# Patient Record
Sex: Female | Born: 1980 | Race: White | Hispanic: No | Marital: Married | Smoking: Never smoker
Health system: Southern US, Community
[De-identification: ages and names within clinical notes are randomized; demographics above are authoritative.]

## PROBLEM LIST (undated history)

## (undated) DIAGNOSIS — R000 Tachycardia, unspecified: Secondary | ICD-10-CM

## (undated) DIAGNOSIS — N809 Endometriosis, unspecified: Secondary | ICD-10-CM

## (undated) DIAGNOSIS — A419 Sepsis, unspecified organism: Secondary | ICD-10-CM

## (undated) DIAGNOSIS — C4491 Basal cell carcinoma of skin, unspecified: Secondary | ICD-10-CM

## (undated) DIAGNOSIS — R079 Chest pain, unspecified: Secondary | ICD-10-CM

## (undated) DIAGNOSIS — E782 Mixed hyperlipidemia: Secondary | ICD-10-CM

## (undated) DIAGNOSIS — R002 Palpitations: Secondary | ICD-10-CM

## (undated) DIAGNOSIS — R7401 Elevation of levels of liver transaminase levels: Secondary | ICD-10-CM

## (undated) DIAGNOSIS — E538 Deficiency of other specified B group vitamins: Secondary | ICD-10-CM

## (undated) DIAGNOSIS — E559 Vitamin D deficiency, unspecified: Secondary | ICD-10-CM

## (undated) DIAGNOSIS — I10 Essential (primary) hypertension: Secondary | ICD-10-CM

## (undated) DIAGNOSIS — E119 Type 2 diabetes mellitus without complications: Secondary | ICD-10-CM

## (undated) HISTORY — DX: Chest pain, unspecified: R07.9

## (undated) HISTORY — DX: Basal cell carcinoma of skin, unspecified: C44.91

## (undated) HISTORY — DX: Type 2 diabetes mellitus without complications: E11.9

## (undated) HISTORY — DX: Elevation of levels of liver transaminase levels: R74.01

## (undated) HISTORY — DX: Deficiency of other specified B group vitamins: E53.8

## (undated) HISTORY — DX: Essential (primary) hypertension: I10

## (undated) HISTORY — DX: Mixed hyperlipidemia: E78.2

## (undated) HISTORY — DX: Palpitations: R00.2

## (undated) HISTORY — DX: Vitamin D deficiency, unspecified: E55.9

## (undated) HISTORY — DX: Tachycardia, unspecified: R00.0

## (undated) HISTORY — DX: Sepsis, unspecified organism: A41.9

## (undated) HISTORY — DX: Endometriosis, unspecified: N80.9

---

## 2014-02-06 HISTORY — PX: LAPAROSCOPY: SHX197

## 2014-02-09 ENCOUNTER — Ambulatory Visit: Payer: Self-pay | Admitting: Obstetrics and Gynecology

## 2014-02-09 LAB — BASIC METABOLIC PANEL
ANION GAP: 6 — AB (ref 7–16)
BUN: 9 mg/dL (ref 7–18)
CO2: 24 mmol/L (ref 21–32)
Calcium, Total: 8.3 mg/dL — ABNORMAL LOW (ref 8.5–10.1)
Chloride: 110 mmol/L — ABNORMAL HIGH (ref 98–107)
Creatinine: 0.79 mg/dL (ref 0.60–1.30)
EGFR (African American): >60
EGFR (Non-African Amer.): >60
Glucose: 97 mg/dL (ref 65–99)
Osmolality: 278 (ref 275–301)
Potassium: 3.4 mmol/L — ABNORMAL LOW (ref 3.5–5.1)
Sodium: 140 mmol/L (ref 136–145)

## 2014-02-09 LAB — HEMOGLOBIN: HGB: 12.3 g/dL (ref 12.0–16.0)

## 2014-02-13 ENCOUNTER — Ambulatory Visit: Payer: Self-pay | Admitting: Obstetrics and Gynecology

## 2014-06-01 LAB — SURGICAL PATHOLOGY

## 2014-06-07 NOTE — Op Note (Signed)
PATIENT NAME:  Donna Mercado, Donna Mercado MR#:  [id number] DATE OF BIRTH:  July 18, 1980  DATE OF PROCEDURE:  02/13/2014  PREOPERATIVE DIAGNOSES: 1.  Chronic pelvic pain.  2.  Endometrial polyp.   POSTOPERATIVE DIAGNOSES: 1.  Chronic pelvic pain.  2.  Endometriosis.  3.  Endometrial polyp.   PROCEDURES:  1.  Hysteroscopy. 2.  Endometrial polypectomy. 3.  Laparoscopic excision of posterior cul-de-sac endometriosis.   SURGEON: Laverta Baltimore, M.D.   ANESTHESIA: General endotracheal.  INDICATIONS: A 34 year old gravida 0, a patient with 4 month history of central dyspareunia and central. Ultrasound shows a 2 x 1 cm endometrial polyp.   FINDINGS:  1.  A 7 x 5 mm endometrial polyp. 2.  Scarring in the posterior cul-de-sac consistent with endometriosis.  3.  Powder burn areas of the left mesosalpinx consistent with endometriosis.   DESCRIPTION OF PROCEDURE: After adequate general endotracheal anesthesia, the patient was placed in the dorsal supine position. Legs were placed in the [place]. The patient's abdomen, perineum, and vagina were prepped and draped in normal sterile fashion. Straight catheterization of the bladder yielded 75 mL clear urine. A weighted speculum was placed in the posterior vaginal vault and the anterior cervix was grasped with a single-tooth tenaculum. The cervix was dilated to #20 Hanks dilator without difficulty. The hysteroscope was advanced into the endometrial cavity. The cavity appeared atrophic, except for a 5 x 7 mm pedunculated polyp. Hysteroscope was removed and Randall stone forceps were placed with retrieval of the polyp. Endometrial curettage was performed as well with no additional polyps. Gloves were changed, and the patient was flattened out, and a 12 mm infraumbilical incision was made after injecting with 0.5% Marcaine. The 11 mm laparoscopic port was placed under direct visualization with the Optiview cannula. Once gaining entrance into the abdominal  cavity, the abdomen was insufflated with carbon dioxide. A second port site was placed 3 cm medial to the left anterior iliac spine, and a 5 mm port was advanced under direct visualization in the abdominal cavity. A third port site was placed on the right, again 3 cm medial to the right anterior iliac spine. Under direct visualization, a 5 mm port was advanced.  Initial impression showed some dense white scarring in the posterior cul-de-sac. There was also a few powder burn areas on the left mesosalpinx and the left ovarian fossa had some hemosiderin staining. Harmonic scalpel was brought up and the deep posterior cul-de-sac area was grasped with graspers, and the white scarred area was removed with the Harmonic scalpel. Good hemostasis was noted. The patient's abdomen was copiously irrigated. The upper abdomen appeared normal. The patient's abdomen was deflated then. The infraumbilical incision was closed with a deep fascial layer of 2-0 Vicryl suture and then all skin incisions were closed with interrupted 4-0 Vicryl suture. Dermabond was placed and Tegaderm was placed over top these. The single-tooth tenaculum was removed from the anterior cervix. Good hemostasis was noted. There were no complications. Estimated blood loss minimal. Intraoperative fluids 1000 mL. The patient was taken to the recovery room in good condition. One gram of IV Ancef was used prior to commencement of the case.  ____________________________ Boykin Nearing, MD tjs:sb D: 02/13/2014 11:00:51 ET T: 02/13/2014 12:07:24 ET JOB#: [id number]  cc: Boykin Nearing, MD, <Dictator> Boykin Nearing MD ELECTRONICALLY SIGNED 02/17/2014 9:17

## 2016-11-30 ENCOUNTER — Other Ambulatory Visit: Payer: Self-pay | Admitting: Obstetrics and Gynecology

## 2016-11-30 DIAGNOSIS — Z1231 Encounter for screening mammogram for malignant neoplasm of breast: Secondary | ICD-10-CM

## 2020-01-22 ENCOUNTER — Other Ambulatory Visit: Payer: Self-pay | Admitting: Obstetrics and Gynecology

## 2020-01-22 DIAGNOSIS — Z1231 Encounter for screening mammogram for malignant neoplasm of breast: Secondary | ICD-10-CM

## 2020-02-05 ENCOUNTER — Other Ambulatory Visit: Payer: Self-pay

## 2020-02-05 ENCOUNTER — Ambulatory Visit
Admission: RE | Admit: 2020-02-05 | Discharge: 2020-02-05 | Disposition: A | Discharge status: Home / Self Care | Payer: BC Managed Care – PPO | Source: Ambulatory Visit | Attending: Obstetrics and Gynecology | Admitting: Obstetrics and Gynecology

## 2020-02-05 DIAGNOSIS — Z1231 Encounter for screening mammogram for malignant neoplasm of breast: Secondary | ICD-10-CM | POA: Diagnosis present

## 2020-02-11 ENCOUNTER — Other Ambulatory Visit: Payer: Self-pay | Admitting: Obstetrics and Gynecology

## 2020-02-11 DIAGNOSIS — R921 Mammographic calcification found on diagnostic imaging of breast: Secondary | ICD-10-CM

## 2020-02-11 DIAGNOSIS — R928 Other abnormal and inconclusive findings on diagnostic imaging of breast: Secondary | ICD-10-CM

## 2020-02-17 ENCOUNTER — Ambulatory Visit
Admission: RE | Admit: 2020-02-17 | Discharge: 2020-02-17 | Disposition: A | Discharge status: Home / Self Care | Payer: BC Managed Care – PPO | Source: Ambulatory Visit | Attending: Obstetrics and Gynecology | Admitting: Obstetrics and Gynecology

## 2020-02-17 ENCOUNTER — Other Ambulatory Visit: Payer: Self-pay

## 2020-02-17 DIAGNOSIS — R921 Mammographic calcification found on diagnostic imaging of breast: Secondary | ICD-10-CM | POA: Diagnosis present

## 2020-02-17 DIAGNOSIS — R928 Other abnormal and inconclusive findings on diagnostic imaging of breast: Secondary | ICD-10-CM | POA: Diagnosis not present

## 2020-02-19 ENCOUNTER — Other Ambulatory Visit: Payer: Self-pay | Admitting: Obstetrics and Gynecology

## 2020-02-19 DIAGNOSIS — R928 Other abnormal and inconclusive findings on diagnostic imaging of breast: Secondary | ICD-10-CM

## 2020-02-19 DIAGNOSIS — R921 Mammographic calcification found on diagnostic imaging of breast: Secondary | ICD-10-CM

## 2020-03-02 ENCOUNTER — Other Ambulatory Visit: Payer: Self-pay

## 2020-03-02 ENCOUNTER — Ambulatory Visit
Admission: RE | Admit: 2020-03-02 | Discharge: 2020-03-02 | Disposition: A | Discharge status: Home / Self Care | Payer: BC Managed Care – PPO | Source: Ambulatory Visit | Attending: Obstetrics and Gynecology | Admitting: Obstetrics and Gynecology

## 2020-03-02 DIAGNOSIS — R928 Other abnormal and inconclusive findings on diagnostic imaging of breast: Secondary | ICD-10-CM | POA: Diagnosis present

## 2020-03-02 DIAGNOSIS — R921 Mammographic calcification found on diagnostic imaging of breast: Secondary | ICD-10-CM | POA: Insufficient documentation

## 2020-03-02 HISTORY — PX: BREAST BIOPSY: SHX20

## 2020-03-03 LAB — SURGICAL PATHOLOGY

## 2020-09-27 DIAGNOSIS — C4491 Basal cell carcinoma of skin, unspecified: Secondary | ICD-10-CM | POA: Insufficient documentation

## 2020-09-27 DIAGNOSIS — E782 Mixed hyperlipidemia: Secondary | ICD-10-CM | POA: Insufficient documentation

## 2020-09-27 DIAGNOSIS — R000 Tachycardia, unspecified: Secondary | ICD-10-CM | POA: Insufficient documentation

## 2020-09-27 DIAGNOSIS — A419 Sepsis, unspecified organism: Secondary | ICD-10-CM | POA: Insufficient documentation

## 2020-09-27 DIAGNOSIS — E538 Deficiency of other specified B group vitamins: Secondary | ICD-10-CM | POA: Insufficient documentation

## 2020-09-27 DIAGNOSIS — R102 Pelvic and perineal pain: Secondary | ICD-10-CM

## 2020-09-27 DIAGNOSIS — E119 Type 2 diabetes mellitus without complications: Secondary | ICD-10-CM | POA: Insufficient documentation

## 2020-09-27 DIAGNOSIS — R002 Palpitations: Secondary | ICD-10-CM | POA: Insufficient documentation

## 2020-09-27 DIAGNOSIS — N809 Endometriosis, unspecified: Secondary | ICD-10-CM | POA: Insufficient documentation

## 2020-09-27 DIAGNOSIS — I10 Essential (primary) hypertension: Secondary | ICD-10-CM | POA: Insufficient documentation

## 2020-09-27 DIAGNOSIS — E559 Vitamin D deficiency, unspecified: Secondary | ICD-10-CM | POA: Insufficient documentation

## 2020-09-27 DIAGNOSIS — R7401 Elevation of levels of liver transaminase levels: Secondary | ICD-10-CM | POA: Insufficient documentation

## 2020-09-27 HISTORY — DX: Pelvic and perineal pain: R10.2

## 2020-10-27 ENCOUNTER — Ambulatory Visit: Payer: BC Managed Care – PPO | Admitting: Cardiology

## 2020-11-22 DIAGNOSIS — R079 Chest pain, unspecified: Secondary | ICD-10-CM | POA: Insufficient documentation

## 2020-11-29 ENCOUNTER — Ambulatory Visit: Payer: BC Managed Care – PPO | Admitting: Cardiology

## 2021-02-17 NOTE — Progress Notes (Deleted)
Cardiology Office Note:    Date:  02/17/2021   ID:  Donna Mercado, DOB 1980-12-23, MRN [id number]  PCP:  Schermerhorn, Gwen Her, MD  Cardiologist:  Shirlee More, MD   Referring MD: Boykin Nearing,*  ASSESSMENT:    No diagnosis found. PLAN:    In order of problems listed above:  ***  Next appointment   Medication Adjustments/Labs and Tests Ordered: Current medicines are reviewed at length with the patient today.  Concerns regarding medicines are outlined above.  No orders of the defined types were placed in this encounter.  No orders of the defined types were placed in this encounter.    No chief complaint on file. ***  History of Present Illness:    Donna Mercado is a 41 y.o. female with a history of type 2 diabetes hypertension hyperlipidemia and rapid heart rate who is being seen today for the evaluation of chest pain at the request of Schermerhorn, Gwen Her,*.  She utilized an event monitor I independently reviewed she has rare ventricular and supraventricular arrhythmia and her symptomatic events were sinus rhythm and sinus tachycardia. Past Medical History:  Diagnosis Date   B12 deficiency    Basal cell carcinoma    Chest pain    Chronic tachycardia    DM (diabetes mellitus) (HCC)    Elevated liver transaminase level    Endometriosis    Essential hypertension    Mixed hyperlipidemia    Palpitations    Pelvic pain in female 09/27/2020   Sepsis [facility])    Vitamin D deficiency     Past Surgical History:  Procedure Laterality Date   BREAST BIOPSY Left 03/02/2020   Stereo bx, Ribbon Clip, path pending    LAPAROSCOPY  2016    Current Medications: No outpatient medications have been marked as taking for the 02/18/21 encounter (Appointment) with Richardo Priest, MD.     Allergies:   Patient has no known allergies.   Social History   Socioeconomic History   Marital status: Married    Spouse name: Not on file   Number of children: Not on  file   Years of education: Not on file   Highest education level: Not on file  Occupational History   Not on file  Tobacco Use   Smoking status: Never   Smokeless tobacco: Never  Substance and Sexual Activity   Alcohol use: Not on file   Drug use: Not on file   Sexual activity: Not on file  Other Topics Concern   Not on file  Social History Narrative   Not on file   Social Determinants of Health   Financial Resource Strain: Not on file  Food Insecurity: Not on file  Transportation Needs: Not on file  Physical Activity: Not on file  Stress: Not on file  Social Connections: Not on file     Family History: The patient's ***family history includes Heart disease in her mother; Hypertension in her father and mother. There is no history of Breast cancer.  ROS:   ROS Please see the history of present illness.    *** All other systems reviewed and are negative.  EKGs/Labs/Other Studies Reviewed:    The following studies were reviewed today: ***  EKG:  EKG is *** ordered today.  The ekg ordered today is personally reviewed and demonstrates ***  Recent Labs: No results found for requested labs within last 8760 hours.  Recent Lipid Panel No results found for: CHOL, TRIG, HDL, CHOLHDL, VLDL,  LDLCALC, LDLDIRECT  Physical Exam:    VS:  There were no vitals taken for this visit.    Wt Readings from Last 3 Encounters:  No data found for Wt     GEN: *** Well nourished, well developed in no acute distress HEENT: Normal NECK: No JVD; No carotid bruits LYMPHATICS: No lymphadenopathy CARDIAC: ***RRR, no murmurs, rubs, gallops RESPIRATORY:  Clear to auscultation without rales, wheezing or rhonchi  ABDOMEN: Soft, non-tender, non-distended MUSCULOSKELETAL:  No edema; No deformity  SKIN: Warm and dry NEUROLOGIC:  Alert and oriented x 3 PSYCHIATRIC:  Normal affect     Signed, Shirlee More, MD  02/17/2021 5:28 PM    [place]

## 2021-02-18 ENCOUNTER — Ambulatory Visit: Payer: BC Managed Care – PPO | Admitting: Cardiology

## 2021-08-11 IMAGING — MG MM BREAST LOCALIZATION CLIP
8 series · 9 of 24 positions shown · non-contrast
Comparison: Previous exam(s).

CLINICAL DATA: Evaluate biopsy clip

EXAM:
DIAGNOSTIC LEFT MAMMOGRAM POST STEREOTACTIC BIOPSY

[L LM synth-2D]
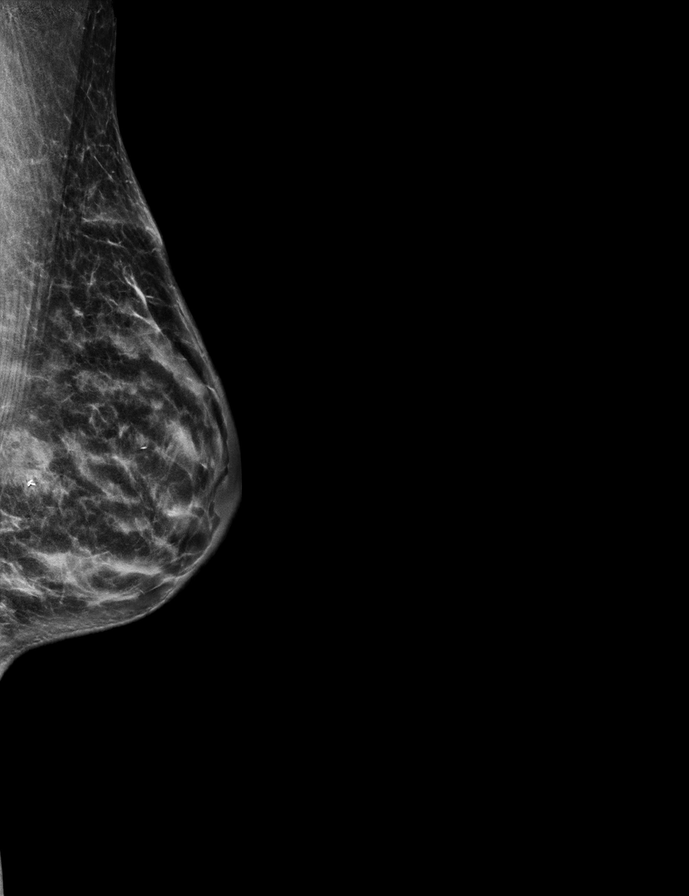

[L MLO synth-2D]
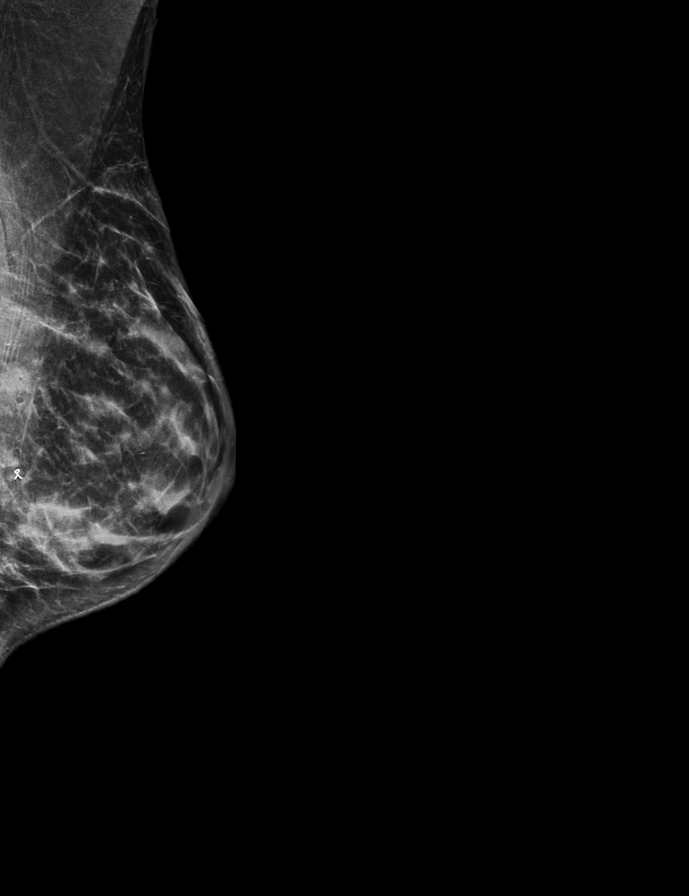

[L CC synth-2D (1 of 2)]
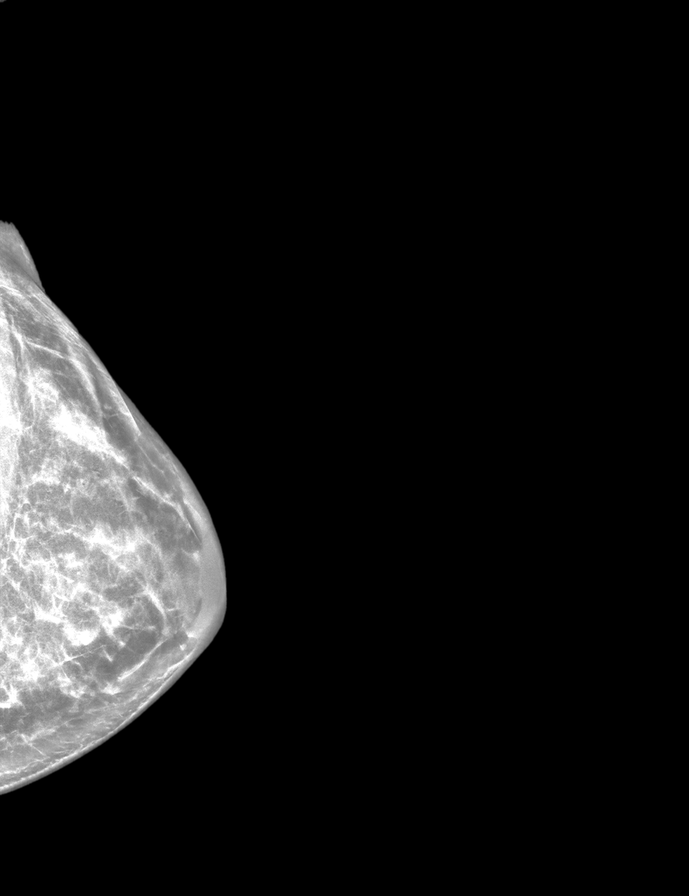

[L CC synth-2D (2 of 2)]
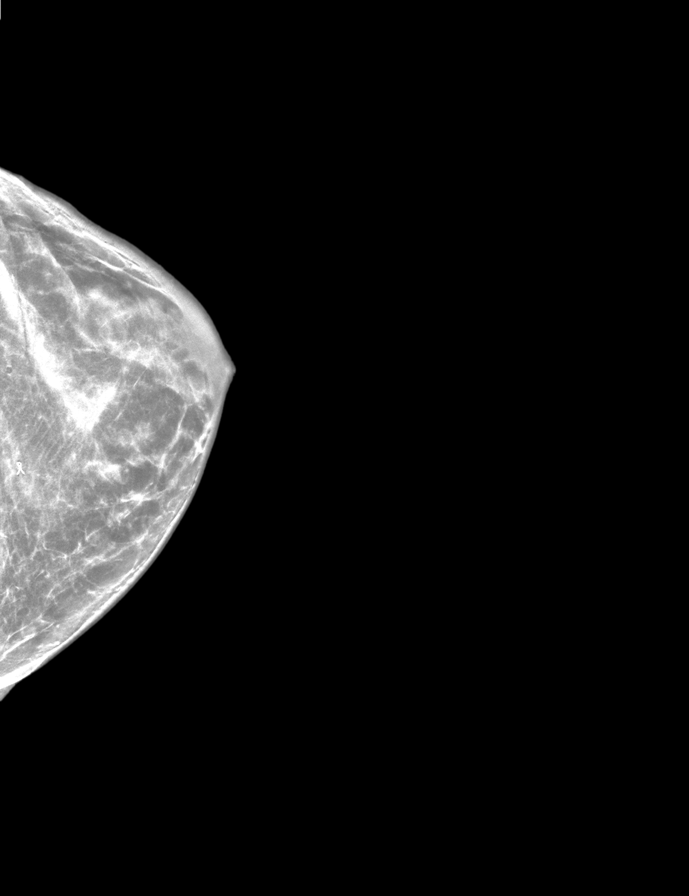

[L MLO tomo · 2 of 52 frames shown]
[frame 17/52]
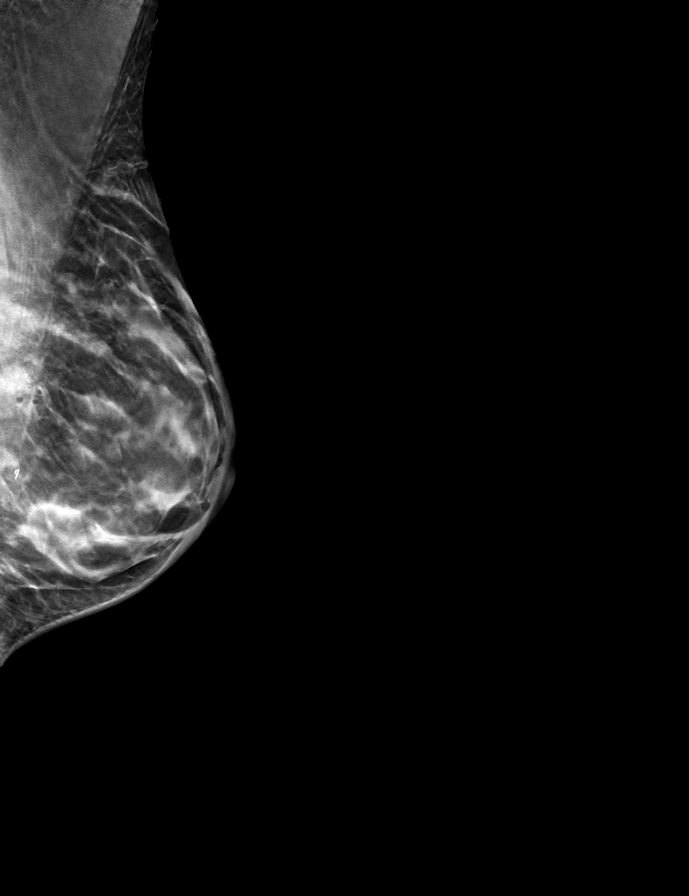
[frame 27/52]
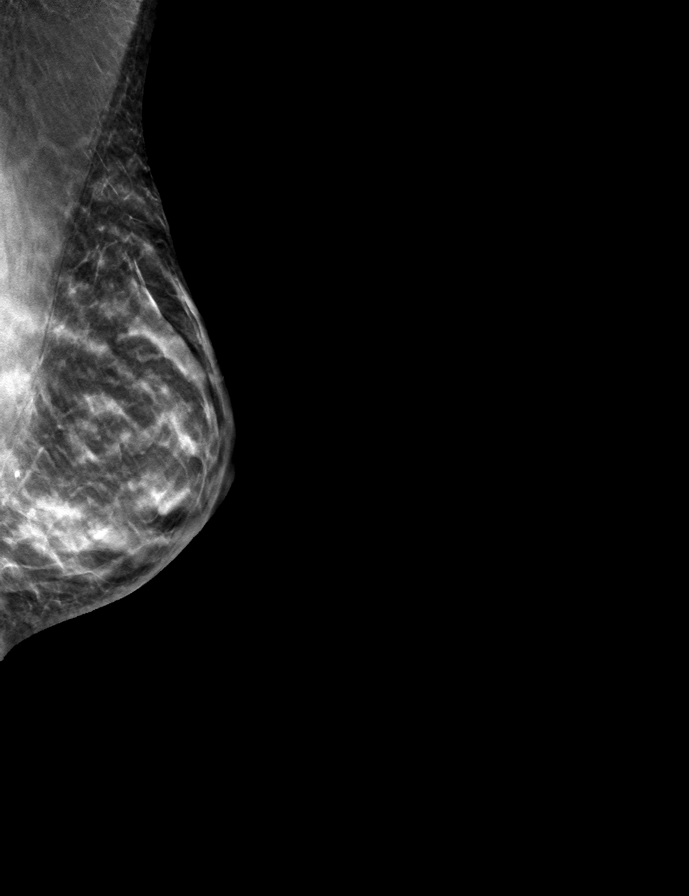

[L CC tomo (1 of 2) · tomo slice 25/50.0]
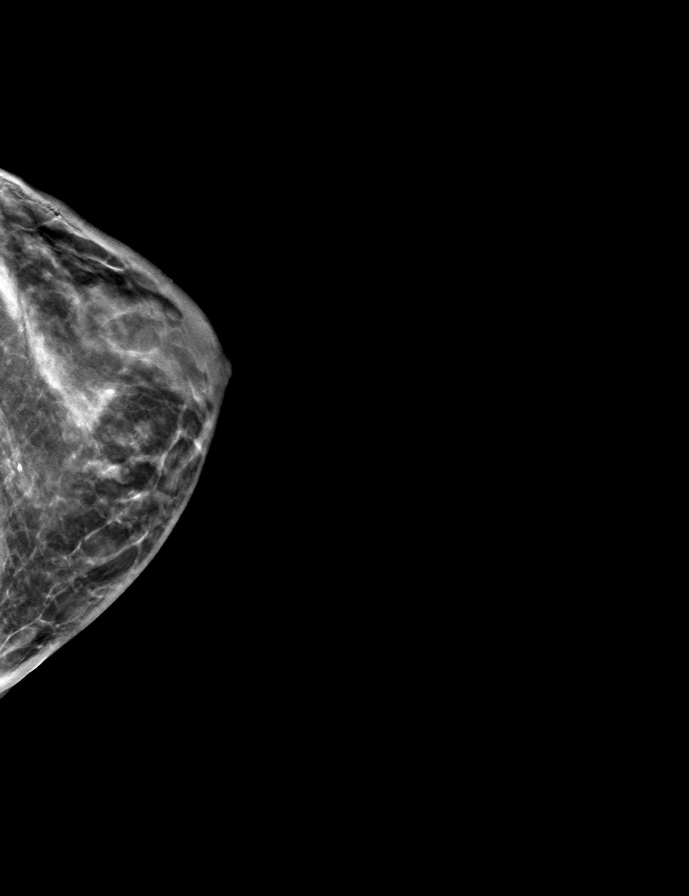

[L LM tomo · tomo slice 25/50.0]
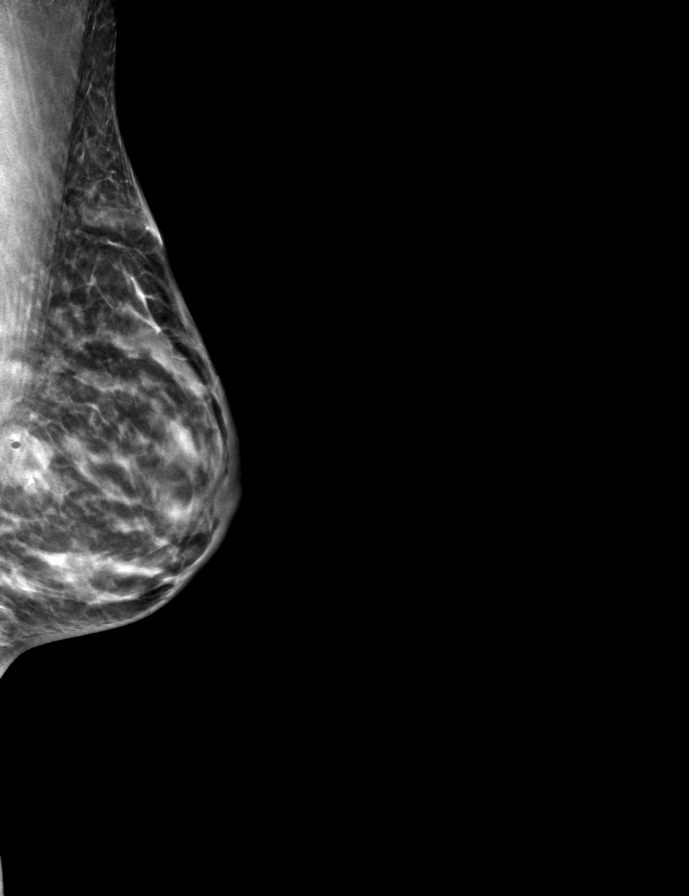

[L CC tomo (2 of 2) · tomo slice 28/55.0]
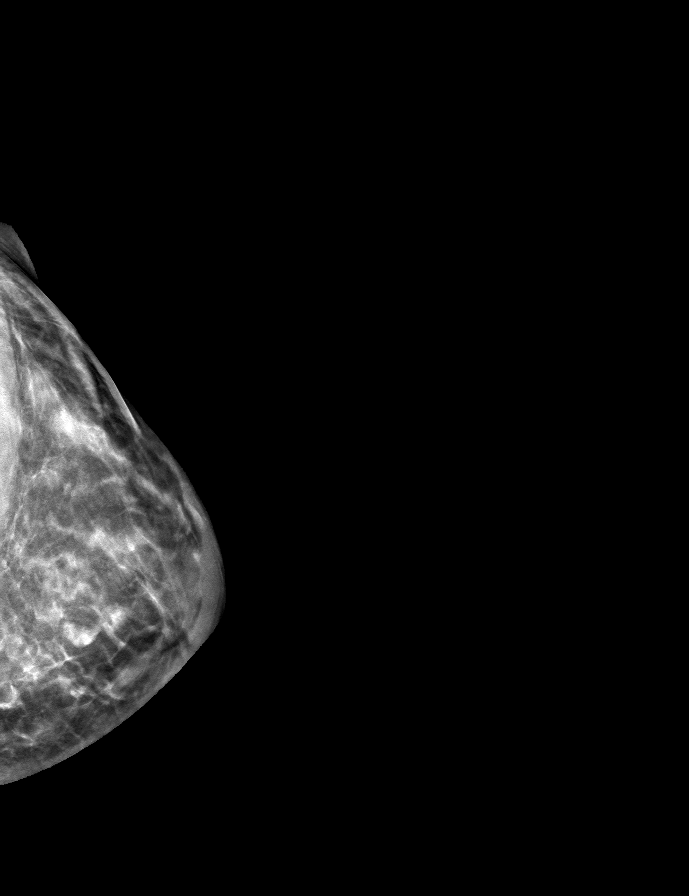

[9 of 24 positions shown; findings below may reference images not displayed]

FINDINGS: Mammographic images were obtained following stereotactic guided
biopsy of left breast calcifications. The biopsy clip migrated at
least 4.9 cm medial to the biopsied calcifications.
IMPRESSION: The ribbon shaped biopsy clip migrated at least 4.9 cm medial to the
biopsied calcifications.

If the biopsy demonstrates malignancy, recommend breast MRI with
clip placement at the biopsy site to aid in surgical localization.

Final Assessment: Post Procedure Mammograms for Marker Placement

## 2023-08-31 ENCOUNTER — Other Ambulatory Visit: Payer: Self-pay | Admitting: Nurse Practitioner

## 2023-08-31 DIAGNOSIS — Z1231 Encounter for screening mammogram for malignant neoplasm of breast: Secondary | ICD-10-CM

## 2023-09-14 ENCOUNTER — Ambulatory Visit
Admission: RE | Admit: 2023-09-14 | Discharge: 2023-09-14 | Disposition: A | Discharge status: Home / Self Care | Source: Ambulatory Visit | Attending: Nurse Practitioner | Admitting: Nurse Practitioner

## 2023-09-14 DIAGNOSIS — Z1231 Encounter for screening mammogram for malignant neoplasm of breast: Secondary | ICD-10-CM

## 2023-09-19 ENCOUNTER — Other Ambulatory Visit: Payer: Self-pay | Admitting: Nurse Practitioner

## 2023-09-19 DIAGNOSIS — R928 Other abnormal and inconclusive findings on diagnostic imaging of breast: Secondary | ICD-10-CM

## 2023-09-28 ENCOUNTER — Ambulatory Visit
Admission: RE | Admit: 2023-09-28 | Discharge: 2023-09-28 | Disposition: A | Discharge status: Home / Self Care | Source: Ambulatory Visit | Attending: Nurse Practitioner | Admitting: Nurse Practitioner

## 2023-09-28 DIAGNOSIS — R928 Other abnormal and inconclusive findings on diagnostic imaging of breast: Secondary | ICD-10-CM

## 2023-10-01 ENCOUNTER — Other Ambulatory Visit: Payer: Self-pay | Admitting: Nurse Practitioner

## 2023-10-01 DIAGNOSIS — R921 Mammographic calcification found on diagnostic imaging of breast: Secondary | ICD-10-CM

## 2023-10-15 ENCOUNTER — Encounter

## 2023-10-17 ENCOUNTER — Ambulatory Visit
Admission: RE | Admit: 2023-10-17 | Discharge: 2023-10-17 | Disposition: A | Discharge status: Home / Self Care | Source: Ambulatory Visit | Attending: Nurse Practitioner | Admitting: Nurse Practitioner

## 2023-10-17 DIAGNOSIS — R921 Mammographic calcification found on diagnostic imaging of breast: Secondary | ICD-10-CM

## 2023-10-17 HISTORY — PX: BREAST BIOPSY: SHX20

## 2023-10-19 LAB — SURGICAL PATHOLOGY
